# Patient Record
Sex: Male | Born: 1980 | Race: White | Hispanic: No | Smoking: Current every day smoker
Health system: Southern US, Community
[De-identification: ages and names within clinical notes are randomized; demographics above are authoritative.]

## PROBLEM LIST (undated history)

## (undated) DIAGNOSIS — K219 Gastro-esophageal reflux disease without esophagitis: Secondary | ICD-10-CM

## (undated) DIAGNOSIS — G44009 Cluster headache syndrome, unspecified, not intractable: Secondary | ICD-10-CM

## (undated) DIAGNOSIS — Z8489 Family history of other specified conditions: Secondary | ICD-10-CM

---

## 2009-06-30 ENCOUNTER — Emergency Department (HOSPITAL_BASED_OUTPATIENT_CLINIC_OR_DEPARTMENT_OTHER): Admission: EM | Admit: 2009-06-30 | Discharge: 2009-06-30 | Payer: Self-pay | Admitting: Emergency Medicine

## 2010-08-04 LAB — URINALYSIS, ROUTINE W REFLEX MICROSCOPIC
Bilirubin Urine: NEGATIVE
Ketones, ur: NEGATIVE mg/dL
Nitrite: NEGATIVE
Urobilinogen, UA: 0.2 mg/dL (ref 0.0–1.0)
pH: 7.5 (ref 5.0–8.0)

## 2010-08-04 LAB — DIFFERENTIAL
Basophils Absolute: 0 10*3/uL (ref 0.0–0.1)
Eosinophils Absolute: 0.1 10*3/uL (ref 0.0–0.7)
Eosinophils Relative: 1 % (ref 0–5)

## 2010-08-04 LAB — CBC
MCV: 89.3 fL (ref 78.0–100.0)
RBC: 5.47 MIL/uL (ref 4.22–5.81)
WBC: 6.9 10*3/uL (ref 4.0–10.5)

## 2010-08-04 LAB — COMPREHENSIVE METABOLIC PANEL
ALT: 72 U/L — ABNORMAL HIGH (ref 0–53)
AST: 50 U/L — ABNORMAL HIGH (ref 0–37)
CO2: 30 mEq/L (ref 19–32)
Chloride: 105 mEq/L (ref 96–112)
GFR calc Af Amer: >60 mL/min (ref >60)
GFR calc non Af Amer: >60 mL/min (ref >60)
Sodium: 143 mEq/L (ref 135–145)
Total Bilirubin: 0.4 mg/dL (ref 0.3–1.2)

## 2010-08-04 LAB — LIPASE, BLOOD: Lipase: 154 U/L (ref 23–300)

## 2012-03-04 ENCOUNTER — Emergency Department (HOSPITAL_BASED_OUTPATIENT_CLINIC_OR_DEPARTMENT_OTHER)
Admission: EM | Admit: 2012-03-04 | Discharge: 2012-03-04 | Discharge status: Against Medical Advice | Payer: Self-pay | Attending: Emergency Medicine | Admitting: Emergency Medicine

## 2012-03-04 ENCOUNTER — Encounter (HOSPITAL_BASED_OUTPATIENT_CLINIC_OR_DEPARTMENT_OTHER): Payer: Self-pay | Admitting: Emergency Medicine

## 2012-03-04 DIAGNOSIS — R51 Headache: Secondary | ICD-10-CM | POA: Insufficient documentation

## 2012-03-04 DIAGNOSIS — F17200 Nicotine dependence, unspecified, uncomplicated: Secondary | ICD-10-CM | POA: Insufficient documentation

## 2012-03-04 DIAGNOSIS — K219 Gastro-esophageal reflux disease without esophagitis: Secondary | ICD-10-CM | POA: Insufficient documentation

## 2012-03-04 HISTORY — DX: Gastro-esophageal reflux disease without esophagitis: K21.9

## 2012-03-04 NOTE — ED Notes (Addendum)
Intermittent HAs since 0800 this morning lasting from 30 to 90 minutes.  Currently top of head is tender to touch.  During his HA he sts "I'd like to cut my head off."  Only one episode of this type of HA in the past - 1 yr ago.

## 2012-03-04 NOTE — ED Notes (Signed)
Called pt to treatment room with no answer from lobby 

## 2012-03-04 NOTE — ED Notes (Signed)
Called from lobby to treatment room with no answer. 

## 2012-03-05 ENCOUNTER — Emergency Department (HOSPITAL_BASED_OUTPATIENT_CLINIC_OR_DEPARTMENT_OTHER): Payer: Self-pay

## 2012-03-05 ENCOUNTER — Emergency Department (HOSPITAL_BASED_OUTPATIENT_CLINIC_OR_DEPARTMENT_OTHER)
Admission: EM | Admit: 2012-03-05 | Discharge: 2012-03-05 | Disposition: A | Discharge status: Home / Self Care | Payer: Self-pay | Attending: Emergency Medicine | Admitting: Emergency Medicine

## 2012-03-05 ENCOUNTER — Encounter (HOSPITAL_BASED_OUTPATIENT_CLINIC_OR_DEPARTMENT_OTHER): Payer: Self-pay

## 2012-03-05 DIAGNOSIS — R51 Headache: Secondary | ICD-10-CM | POA: Insufficient documentation

## 2012-03-05 DIAGNOSIS — Z8719 Personal history of other diseases of the digestive system: Secondary | ICD-10-CM | POA: Insufficient documentation

## 2012-03-05 DIAGNOSIS — J329 Chronic sinusitis, unspecified: Secondary | ICD-10-CM | POA: Insufficient documentation

## 2012-03-05 DIAGNOSIS — R112 Nausea with vomiting, unspecified: Secondary | ICD-10-CM | POA: Insufficient documentation

## 2012-03-05 DIAGNOSIS — F17200 Nicotine dependence, unspecified, uncomplicated: Secondary | ICD-10-CM | POA: Insufficient documentation

## 2012-03-05 MED ORDER — ISOMETHEPTENE-APAP-DICHLORAL 65-325-100 MG PO CAPS: 1.0000 | ORAL_CAPSULE | Freq: Four times a day (QID) | ORAL | Status: DC | PRN

## 2012-03-05 MED ORDER — AMOXICILLIN 500 MG PO CAPS: 500.0000 mg | ORAL_CAPSULE | Freq: Three times a day (TID) | ORAL | Status: DC

## 2012-03-05 NOTE — ED Notes (Signed)
Patient transported to CT 

## 2012-03-05 NOTE — ED Notes (Signed)
Pt reports a headache and nausea that started yesterday.  He came to ED but left prior to being seen.

## 2012-03-05 NOTE — ED Provider Notes (Addendum)
History     CSN: [id number] Arrival date & time 03/05/12  1710 First MD Initiated Contact with Patient 03/05/12 1727     Chief Complaint  Patient presents with  . Headache    Patient is a 31 y.o. male presenting with headaches. The history is provided by the patient.  Headache  The current episode started yesterday. The problem occurs every few minutes. The problem has been gradually improving. The headache is associated with nothing. Pain location: all over. The quality of the pain is described as sharp. The pain is at a severity of 10/10. The pain does not radiate. Associated symptoms include nausea and vomiting. Pertinent negatives include no anorexia and no fever. He has tried nothing for the symptoms. The treatment provided no relief.  Pt has this before in the past.  The headaches have been coming and going over the last day.  It is severe when it occurs.  Last night it was more severe but today it has been sore.  The headache is a 1/10. No fever.  No neck stiffness  Past Medical History  Diagnosis Date  . GERD (gastroesophageal reflux disease)     History reviewed. No pertinent past surgical history.  No family history on file.  History  Substance Use Topics  . Smoking status: Current Every Day Smoker -- 0.5 packs/day  . Smokeless tobacco: Never Used  . Alcohol Use: Yes     occasionally      Review of Systems  Constitutional: Negative for fever.  Gastrointestinal: Positive for nausea and vomiting. Negative for anorexia.  Neurological: Positive for headaches.  All other systems reviewed and are negative.    Allergies  Review of patient's allergies indicates no known allergies.  Home Medications   Current Outpatient Rx  Name Route Sig Dispense Refill  . OMEPRAZOLE 20 MG PO CPDR Oral Take 20 mg by mouth 3 (three) times daily as needed.      BP 154/94  Pulse 79  Temp 98.6 F (37 C) (Oral)  Resp 16  Ht 6\' 1"  (1.854 m)  Wt 200 lb (90.719 kg)  BMI 26.39  kg/m2  SpO2 100%  Physical Exam  Nursing note and vitals reviewed. Constitutional: He is oriented to person, place, and time. He appears well-developed and well-nourished. No distress.  HENT:  Head: Normocephalic and atraumatic.  Right Ear: External ear normal.  Left Ear: External ear normal.  Mouth/Throat: Oropharynx is clear and moist.  Eyes: Conjunctivae normal are normal. Right eye exhibits no discharge. Left eye exhibits no discharge. No scleral icterus.  Neck: Neck supple. No tracheal deviation present.  Cardiovascular: Normal rate, regular rhythm and intact distal pulses.   Pulmonary/Chest: Effort normal and breath sounds normal. No stridor. No respiratory distress. He has no wheezes. He has no rales.  Abdominal: Soft. Bowel sounds are normal. He exhibits no distension. There is no tenderness. There is no rebound and no guarding.  Musculoskeletal: He exhibits no edema and no tenderness.  Neurological: He is alert and oriented to person, place, and time. He has normal strength. No cranial nerve deficit ( no gross defecits noted) or sensory deficit. He exhibits normal muscle tone. He displays no seizure activity. Coordination normal.       No pronator drift bilateral upper extrem, able to hold both legs off bed for 5 seconds, sensation intact in all extremities, no visual field cuts, no left or right sided neglect  Skin: Skin is warm and dry. No rash noted.  Psychiatric: He  has a normal mood and affect.    ED Course  Procedures (including critical care time)  Labs Reviewed - No data to display Ct Head Wo Contrast  03/05/2012  *RADIOLOGY REPORT*  Clinical Data: Headache, nausea and vomiting  CT HEAD WITHOUT CONTRAST  Technique:  Contiguous axial images were obtained from the base of the skull through the vertex without contrast.  Comparison: None.  Findings: The ventricular system is normal in size and configuration, and the septum is in a normal midline position.  The fourth  ventricle and basilar cisterns appear normal.  No hemorrhage, mass lesion, or acute infarction is seen.  On bone window images there is air fluid level in both maxillary sinuses consistent with bilateral maxillary sinusitis.  No acute calvarial abnormality is seen.  IMPRESSION:  1.  No acute intracranial abnormality. 2.  Bilateral maxillary sinusitis.   Original Report Authenticated By: Juline Patch, M.D.      MDM  Patient describes recurrent headaches that started yesterday although he has had these before. There is no thunderclap onset to suggest subarachnoid hemorrhage. The patient's symptoms have all resolved at this point. It is possible he could be having cluster headaches versus migraines. Abdomen prescription for Imitrex and I recommended she follow up with primary doctor or Dr. or further evaluation if the symptoms persist  CT scan does show bilateral maxillary sinusitis. It is possible this could be related to his headaches although patient has not mentioned any significant nasal congestion or URI symptoms   Celene Kras, MD 03/05/12 1820

## 2012-06-23 ENCOUNTER — Emergency Department (HOSPITAL_BASED_OUTPATIENT_CLINIC_OR_DEPARTMENT_OTHER)
Admission: EM | Admit: 2012-06-23 | Discharge: 2012-06-23 | Disposition: A | Discharge status: Home / Self Care | Payer: 59 | Attending: Emergency Medicine | Admitting: Emergency Medicine

## 2012-06-23 ENCOUNTER — Encounter (HOSPITAL_BASED_OUTPATIENT_CLINIC_OR_DEPARTMENT_OTHER): Payer: Self-pay | Admitting: *Deleted

## 2012-06-23 DIAGNOSIS — F17200 Nicotine dependence, unspecified, uncomplicated: Secondary | ICD-10-CM | POA: Insufficient documentation

## 2012-06-23 DIAGNOSIS — K219 Gastro-esophageal reflux disease without esophagitis: Secondary | ICD-10-CM | POA: Insufficient documentation

## 2012-06-23 DIAGNOSIS — G43909 Migraine, unspecified, not intractable, without status migrainosus: Secondary | ICD-10-CM | POA: Insufficient documentation

## 2012-06-23 DIAGNOSIS — Z79899 Other long term (current) drug therapy: Secondary | ICD-10-CM | POA: Insufficient documentation

## 2012-06-23 HISTORY — DX: Cluster headache syndrome, unspecified, not intractable: G44.009

## 2012-06-23 MED ORDER — METOCLOPRAMIDE HCL 10 MG PO TABS
10.0000 mg | ORAL_TABLET | Freq: Once | ORAL | Status: AC
  Administered 2012-06-23: 10 mg via ORAL
  Filled 2012-06-23: qty 1

## 2012-06-23 MED ORDER — ISOMETHEPTENE-APAP-DICHLORAL 65-325-100 MG PO CAPS: 1.0000 | ORAL_CAPSULE | Freq: Four times a day (QID) | ORAL | Status: DC | PRN

## 2012-06-23 MED ORDER — KETOROLAC TROMETHAMINE 60 MG/2ML IM SOLN
60.0000 mg | Freq: Once | INTRAMUSCULAR | Status: AC
  Administered 2012-06-23: 60 mg via INTRAMUSCULAR
  Filled 2012-06-23: qty 2

## 2012-06-23 NOTE — ED Provider Notes (Signed)
History  This chart was scribed for Richardean Canal, MD by Shari Heritage, ED Scribe. The patient was seen in room MH05/MH05. Patient's care was started at 1806.   CSN: [id number]  Arrival date & time 06/23/12  1625   First MD Initiated Contact with Patient 06/23/12 1806      Chief Complaint  Patient presents with  . Headache    The history is provided by the patient. No language interpreter was used.    HPI Comments: Alan Young is a 32 y.o. male with history of cluster headaches who presents to the Emergency Department complaining of constant, non-radiating bilateral temporoparietal headache onset 24 hours ago. Patient denies any associated symptoms at this time. Patient states that he was diagnosed with cluster headaches here in November 2013. He says that he was given an injection of pain medicine during his last visit for treatment, but does not remember what the medicine was called. Patient has taken midrin for this problem in the past, but states that he ran out in December. Patient has not followed up with a neurologist for his problem. Patient's other medical history includes GERD.    Past Medical History  Diagnosis Date  . GERD (gastroesophageal reflux disease)   . Cluster headaches     History reviewed. No pertinent past surgical history.  History reviewed. No pertinent family history.  History  Substance Use Topics  . Smoking status: Current Every Day Smoker -- 0.50 packs/day  . Smokeless tobacco: Never Used  . Alcohol Use: Yes     Comment: occasionally      Review of Systems  Neurological: Positive for headaches. Negative for dizziness.  All other systems reviewed and are negative.    Allergies  Review of patient's allergies indicates no known allergies.  Home Medications   Current Outpatient Rx  Name  Route  Sig  Dispense  Refill  . amoxicillin (AMOXIL) 500 MG capsule   Oral   Take 1 capsule (500 mg total) by mouth 3 (three) times daily.   21 capsule  0   . isometheptene-acetaminophen-dichloralphenazone (MIDRIN) 65-325-100 MG capsule   Oral   Take 1 capsule by mouth 4 (four) times daily as needed.   30 capsule   0   . omeprazole (PRILOSEC) 20 MG capsule   Oral   Take 20 mg by mouth 3 (three) times daily as needed.           Triage Vitals: BP 161/92  Pulse 98  Temp(Src) 98.2 F (36.8 C) (Oral)  Resp 16  Ht 6\' 1"  (1.854 m)  Wt 190 lb (86.183 kg)  BMI 25.07 kg/m2  SpO2 100%  Physical Exam  Constitutional: He is oriented to person, place, and time. He appears well-developed and well-nourished. No distress.  HENT:  Head: Normocephalic and atraumatic.  Eyes: Conjunctivae and EOM are normal. Pupils are equal, round, and reactive to light.  Neck: Normal range of motion. Neck supple.  Cardiovascular: Normal rate and regular rhythm.   Pulmonary/Chest: Effort normal and breath sounds normal.  Abdominal: He exhibits no distension.  Musculoskeletal: Normal range of motion.  Neurological: He is alert and oriented to person, place, and time.  No focal neurological deficits. Moving all extremities without difficulty.  Skin: Skin is warm and dry. No rash noted.  Psychiatric: He has a normal mood and affect. His behavior is normal.    ED Course  Procedures (including critical care time) DIAGNOSTIC STUDIES: Oxygen Saturation is 100% on room air, normal by my  interpretation.    COORDINATION OF CARE: 6:08 PM- Patient informed of current plan for treatment and evaluation and agrees with plan at this time.      Labs Reviewed - No data to display No results found.   No diagnosis found.    MDM  Alan Young is a 32 y.o. male here with headache. It was his typical migraine/cluster headache (didn't have workup before). Pain improved with toradol and reglan IM. Nonfocal neuro exam and no concern for subarachnoid. He wants another prescription of midrin so I gave him a refill. I told him to f/u with his PMD or neurologist to get  better control.     I personally performed the services described in this documentation, which was scribed in my presence. The recorded information has been reviewed and is accurate.    Richardean Canal, MD 06/23/12 [phone]

## 2012-06-23 NOTE — ED Notes (Addendum)
Pt sattes he has a hx of cluster headaches-dx'd by [place]. Has had this one x 24 hours. PERL

## 2013-07-14 ENCOUNTER — Encounter (HOSPITAL_BASED_OUTPATIENT_CLINIC_OR_DEPARTMENT_OTHER): Payer: Self-pay | Admitting: Emergency Medicine

## 2013-07-14 ENCOUNTER — Emergency Department (HOSPITAL_BASED_OUTPATIENT_CLINIC_OR_DEPARTMENT_OTHER)
Admission: EM | Admit: 2013-07-14 | Discharge: 2013-07-15 | Disposition: A | Discharge status: Home / Self Care | Payer: 59 | Attending: Emergency Medicine | Admitting: Emergency Medicine

## 2013-07-14 DIAGNOSIS — Z79899 Other long term (current) drug therapy: Secondary | ICD-10-CM | POA: Insufficient documentation

## 2013-07-14 DIAGNOSIS — Z792 Long term (current) use of antibiotics: Secondary | ICD-10-CM | POA: Insufficient documentation

## 2013-07-14 DIAGNOSIS — J020 Streptococcal pharyngitis: Secondary | ICD-10-CM | POA: Insufficient documentation

## 2013-07-14 DIAGNOSIS — M542 Cervicalgia: Secondary | ICD-10-CM | POA: Insufficient documentation

## 2013-07-14 DIAGNOSIS — F17200 Nicotine dependence, unspecified, uncomplicated: Secondary | ICD-10-CM | POA: Insufficient documentation

## 2013-07-14 DIAGNOSIS — H9209 Otalgia, unspecified ear: Secondary | ICD-10-CM | POA: Insufficient documentation

## 2013-07-14 DIAGNOSIS — K219 Gastro-esophageal reflux disease without esophagitis: Secondary | ICD-10-CM | POA: Insufficient documentation

## 2013-07-14 NOTE — ED Notes (Signed)
Headache. Fever, stuffy head, neck tightness runny nose, sore throat and ear pain x 1 week.

## 2013-07-15 LAB — RAPID STREP SCREEN (MED CTR MEBANE ONLY): STREPTOCOCCUS, GROUP A SCREEN (DIRECT): POSITIVE — AB

## 2013-07-15 MED ORDER — PENICILLIN G BENZATHINE 1200000 UNIT/2ML IM SUSP
1.2000 10*6.[IU] | Freq: Once | INTRAMUSCULAR | Status: AC
  Administered 2013-07-15: 1.2 10*6.[IU] via INTRAMUSCULAR
  Filled 2013-07-15: qty 2

## 2013-07-15 MED ORDER — IBUPROFEN 800 MG PO TABS
800.0000 mg | ORAL_TABLET | Freq: Once | ORAL | Status: AC
  Administered 2013-07-15: 800 mg via ORAL
  Filled 2013-07-15: qty 1

## 2013-07-15 NOTE — ED Notes (Signed)
Pt reports sick with sinus infection last few weeks, improved, couple of days ago left ear and head started hurting

## 2013-07-15 NOTE — ED Provider Notes (Signed)
CSN: [id number]     Arrival date & time 07/14/13  2122 History  This chart was scribed for Charles B. Bernette MayersSheldon, MD by Beverly MilchJ Harrison Collins, ED Scribe. This patient was seen in room MH05/MH05 and the patient's care was started at 12:03 AM.    Chief Complaint  Patient presents with  . Headache      The history is provided by the patient. No language interpreter was used.   HPI Comments: Alan Young is a 33 y.o. male who presents to the Emergency Department complaining of head aches with associated soreness at the base of the neck, left ear pain, cough, sore throat and fever. Pt says symptoms initially began a week ago peaking roughly 3 days ago. 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 Pt denies edema in the extremities and rash. He reports he is a current smoker.   Past Medical History  Diagnosis Date  . GERD (gastroesophageal reflux disease)   . Cluster headaches    History reviewed. No pertinent past surgical history. No family history on file. History  Substance Use Topics  . Smoking status: Current Every Day Smoker -- 0.50 packs/day  . Smokeless tobacco: Never Used  . Alcohol Use: Yes     Comment: occasionally    Review of Systems A complete 10 system review of systems was obtained and all systems are negative except as noted in the HPI and PMH.     Allergies  Review of patient's allergies indicates no known allergies.  Home Medications   Current Outpatient Rx  Name  Route  Sig  Dispense  Refill  . amphetamine-dextroamphetamine (ADDERALL) 20 MG tablet   Oral   Take 20 mg by mouth daily.         Marland Kitchen. amoxicillin (AMOXIL) 500 MG capsule   Oral   Take 1 capsule (500 mg total) by mouth 3 (three) times daily.   21 capsule   0   . isometheptene-acetaminophen-dichloralphenazone (MIDRIN) 65-325-100 MG capsule   Oral   Take 1 capsule by mouth 4 (four) times daily as needed.   30 capsule   0   .  isometheptene-acetaminophen-dichloralphenazone (MIDRIN) 65-325-100 MG capsule   Oral   Take 1 capsule by mouth 4 (four) times daily as needed.   30 capsule   0   . omeprazole (PRILOSEC) 20 MG capsule   Oral   Take 20 mg by mouth 3 (three) times daily as needed.          Triage Vitals: BP 152/100  Pulse 114  Temp(Src) 99.1 F (37.3 C) (Oral)  Resp 18  Ht 6' (1.829 m)  Wt 215 lb (97.523 kg)  BMI 29.15 kg/m2  Physical Exam  Nursing note and vitals reviewed. Constitutional: He is oriented to person, place, and time. He appears well-developed and well-nourished.  HENT:  Head: Normocephalic and atraumatic.  Right Ear: Tympanic membrane normal.  Left Ear: Tympanic membrane normal.  Mouth/Throat: Posterior oropharyngeal edema and posterior oropharyngeal erythema present. No oropharyngeal exudate.  Eyes: EOM are normal. Pupils are equal, round, and reactive to light.  Neck: Normal range of motion. Neck supple.  No meningismus  Cardiovascular: Normal rate, normal heart sounds and intact distal pulses.   Pulmonary/Chest: Effort normal and breath sounds normal.  Abdominal: Bowel sounds are normal. He exhibits no distension. There is no tenderness.  Musculoskeletal: Normal range of motion. He exhibits no edema and no tenderness.  Lymphadenopathy:  He has cervical adenopathy.  Neurological: He is alert and oriented to person, place, and time. He has normal strength. No cranial nerve deficit or sensory deficit.  Skin: Skin is warm and dry. No rash noted.  Psychiatric: He has a normal mood and affect.    ED Course  Procedures (including critical care time)  DIAGNOSTIC STUDIES:  COORDINATION OF CARE: 12:09 AM- Pt advised of plan for treatment and pt agrees.    Labs Review Labs Reviewed - No data to display Imaging Review No results found.   EKG Interpretation None      MDM   Final diagnoses:  Strep pharyngitis   Strep pos, IM PCN. PCP followup.   I personally  performed the services described in this documentation, which was scribed in my presence. The recorded information has been reviewed and is accurate.     Charles B. Bernette Mayers, MD 07/15/13 [phone]

## 2013-07-15 NOTE — Discharge Instructions (Signed)
Strep Throat  Strep throat is an infection of the throat caused by a bacteria named Streptococcus pyogenes. Your caregiver may call the infection streptococcal "tonsillitis" or "pharyngitis" depending on whether there are signs of inflammation in the tonsils or back of the throat. Strep throat is most common in children aged 34 15 years during the cold months of the year, but it can occur in people of any age during any season. This infection is spread from person to person (contagious) through coughing, sneezing, or other close contact.  SYMPTOMS   · Fever or chills.  · Painful, swollen, red tonsils or throat.  · Pain or difficulty when swallowing.  · White or yellow spots on the tonsils or throat.  · Swollen, tender lymph nodes or "glands" of the neck or under the jaw.  · Red rash all over the body (rare).  DIAGNOSIS   Many different infections can cause the same symptoms. A test must be done to confirm the diagnosis so the right treatment can be given. A "rapid strep test" can help your caregiver make the diagnosis in a few minutes. If this test is not available, a light swab of the infected area can be used for a throat culture test. If a throat culture test is done, results are usually available in a day or two.  TREATMENT   Strep throat is treated with antibiotic medicine.  HOME CARE INSTRUCTIONS   · Gargle with 1 tsp of salt in 1 cup of warm water, 3 4 times per day or as needed for comfort.  · Family members who also have a sore throat or fever should be tested for strep throat and treated with antibiotics if they have the strep infection.  · Make sure everyone in your household washes their hands well.  · Do not share food, drinking cups, or personal items that could cause the infection to spread to others.  · You may need to eat a soft food diet until your sore throat gets better.  · Drink enough water and fluids to keep your urine clear or pale yellow. This will help prevent dehydration.  · Get plenty of  rest.  · Stay home from school, daycare, or work until you have been on antibiotics for 24 hours.  · Only take over-the-counter or prescription medicines for pain, discomfort, or fever as directed by your caregiver.  · If antibiotics are prescribed, take them as directed. Finish them even if you start to feel better.  SEEK MEDICAL CARE IF:   · The glands in your neck continue to enlarge.  · You develop a rash, cough, or earache.  · You cough up green, yellow-brown, or bloody sputum.  · You have pain or discomfort not controlled by medicines.  · Your problems seem to be getting worse rather than better.  SEEK IMMEDIATE MEDICAL CARE IF:   · You develop any new symptoms such as vomiting, severe headache, stiff or painful neck, chest pain, shortness of breath, or trouble swallowing.  · You develop severe throat pain, drooling, or changes in your voice.  · You develop swelling of the neck, or the skin on the neck becomes red and tender.  · You have a fever.  · You develop signs of dehydration, such as fatigue, dry mouth, and decreased urination.  · You become increasingly sleepy, or you cannot wake up completely.  Document Released: 04/28/2000 Document Revised: 04/17/2012 Document Reviewed: 06/30/2010  [employer]® Patient Information ©2014 [employer].

## 2014-03-11 IMAGING — CT CT HEAD W/O CM
1 series · 16 of 30 positions shown, 20 images · non-contrast
Comparison: None.

CLINICAL DATA: Headache, nausea and vomiting

CT HEAD WITHOUT CONTRAST
TECHNIQUE: Contiguous axial images were obtained from the base of
the skull through the vertex without contrast.

[Series 2: head 4.8 h37s · axial · 0.45mm/px · z∈[+1116,+1252]mm · 16 of 32 slices shown, 20 images]
[im 2/32  brain]
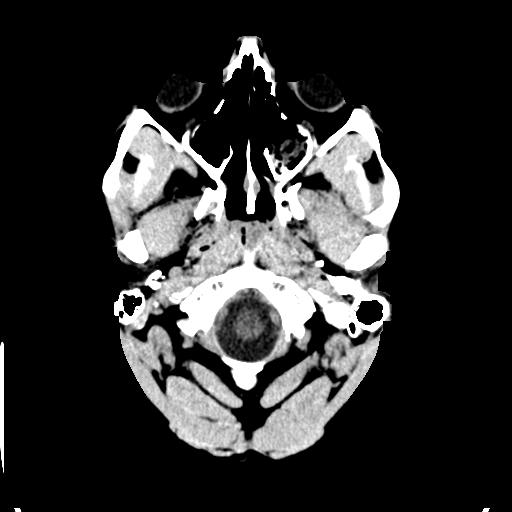
[im 2/32  bone]
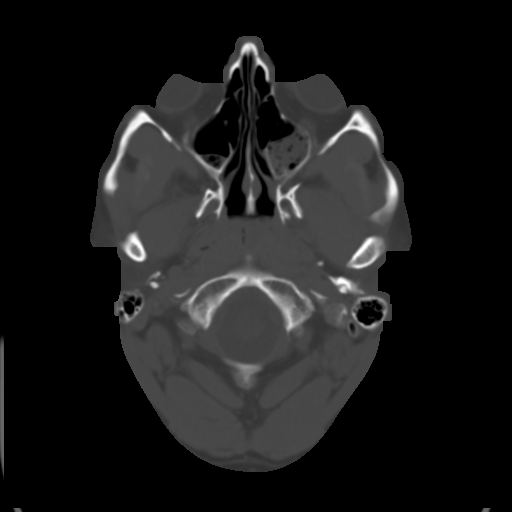
[im 4/32  brain]
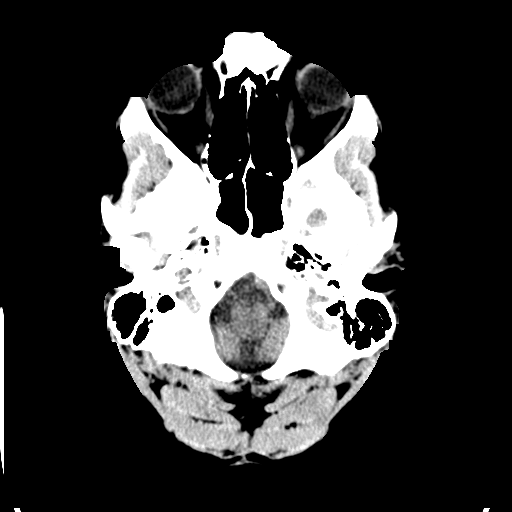
[im 6/32  brain]
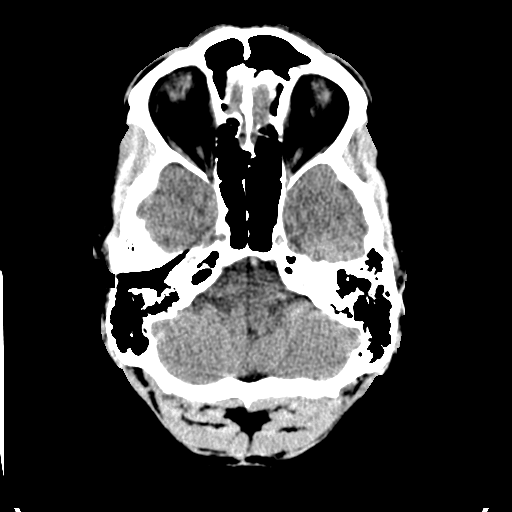
[im 8/32  brain]
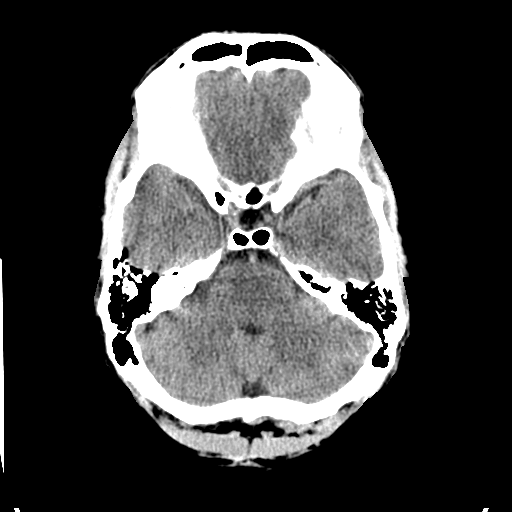
[im 9/32  brain]
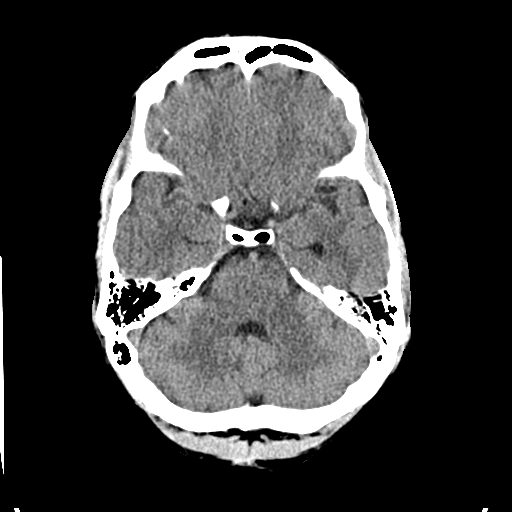
[im 9/32  bone]
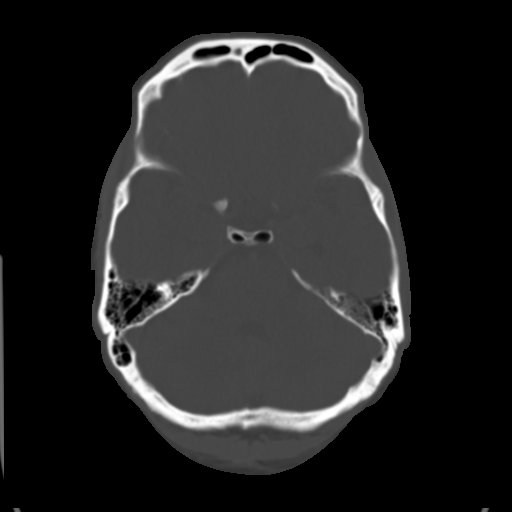
[im 11/32  brain]
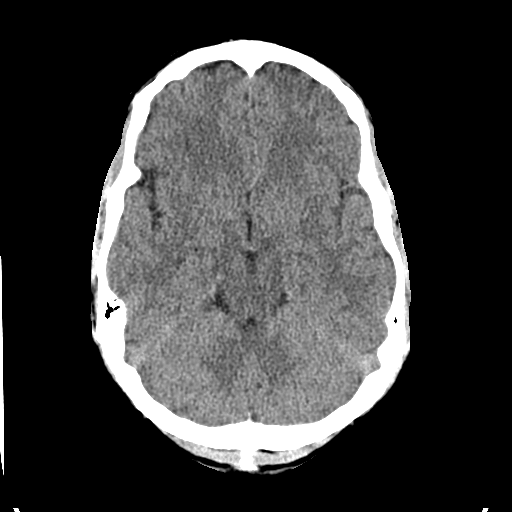
[im 13/32  brain]
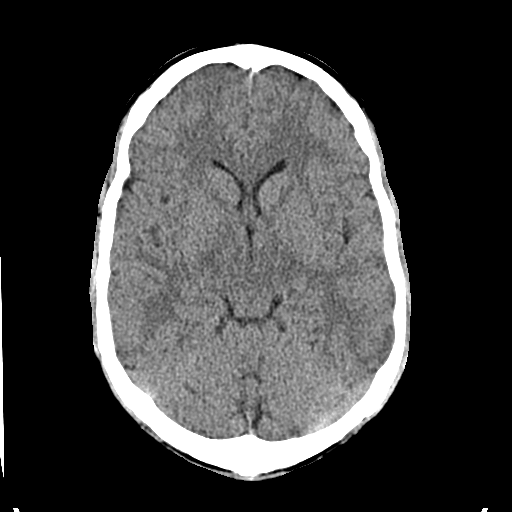
[im 15/32  brain]
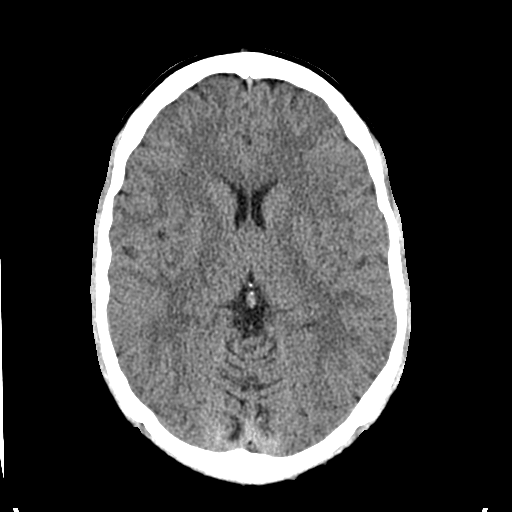
[im 17/32  brain]
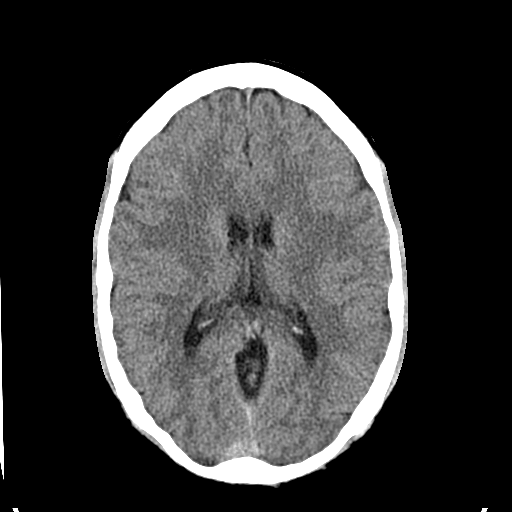
[im 17/32  bone]
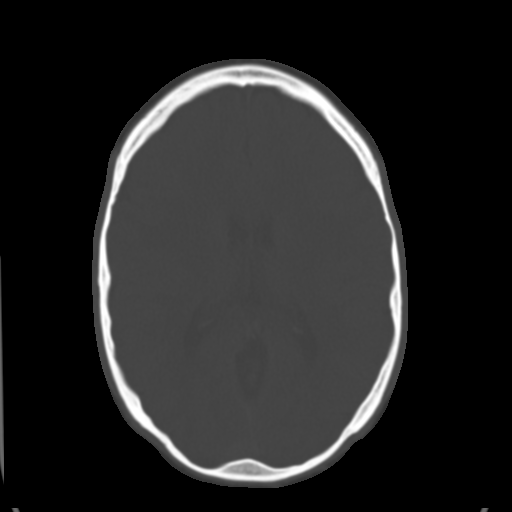
[im 19/32  brain]
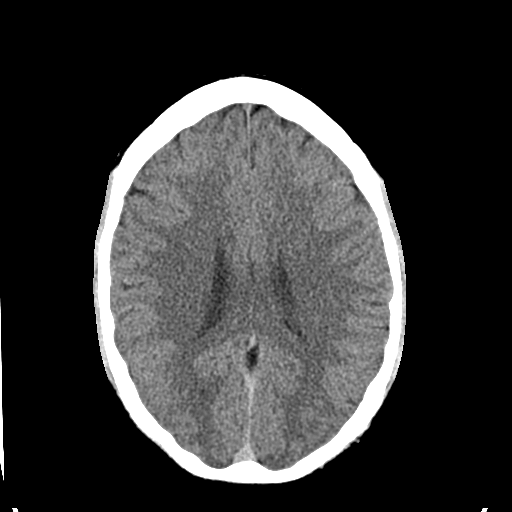
[im 21/32  brain]
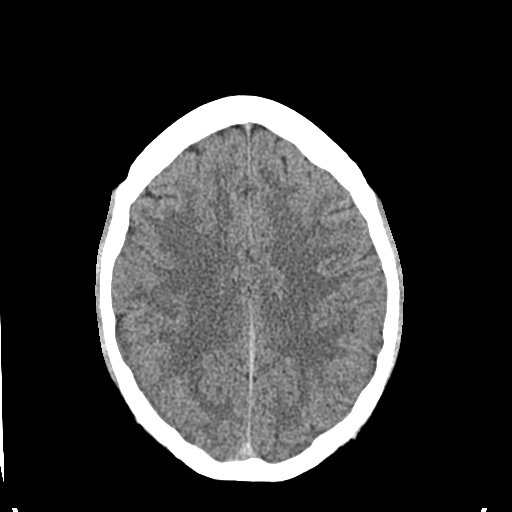
[im 23/32  brain]
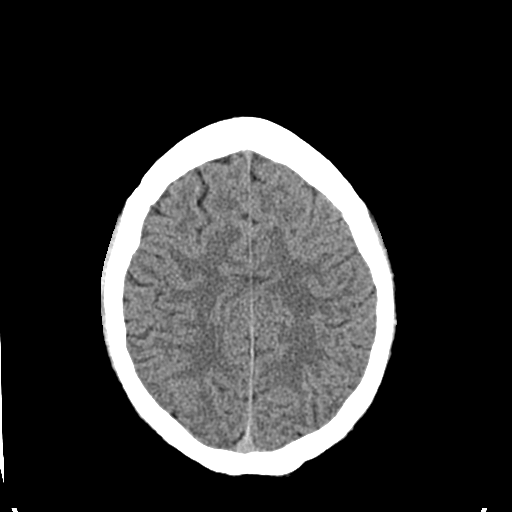
[im 24/32  brain]
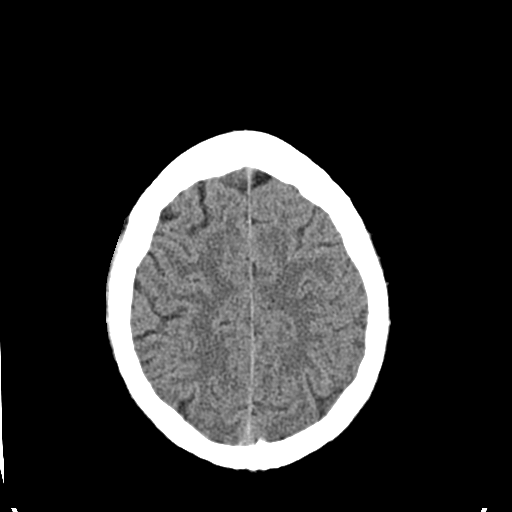
[im 24/32  bone]
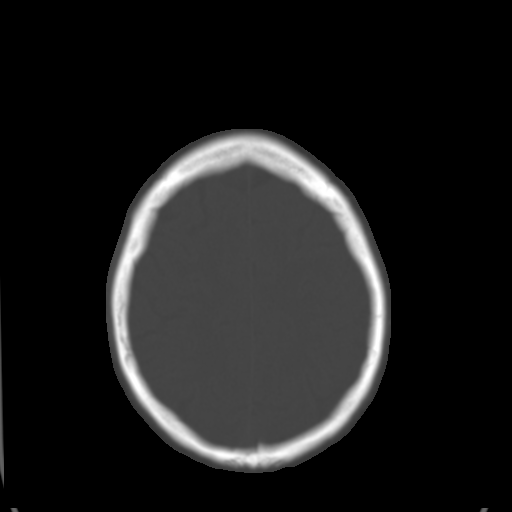
[im 26/32  brain]
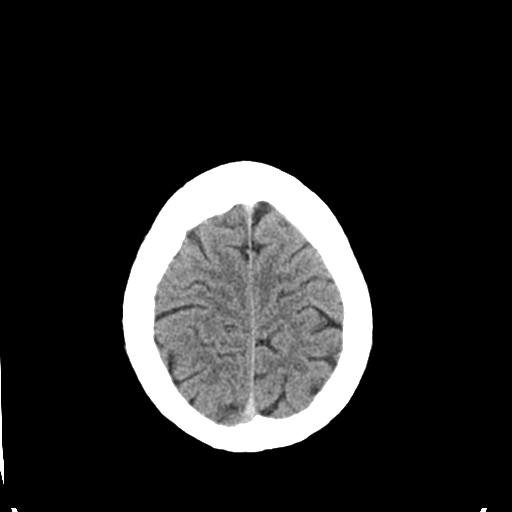
[im 28/32  brain]
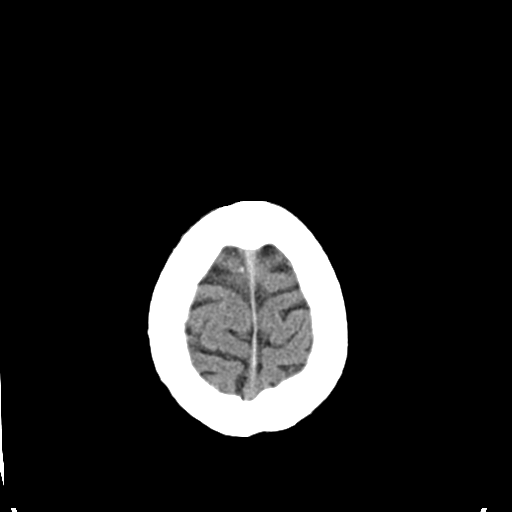
[im 30/32  brain]
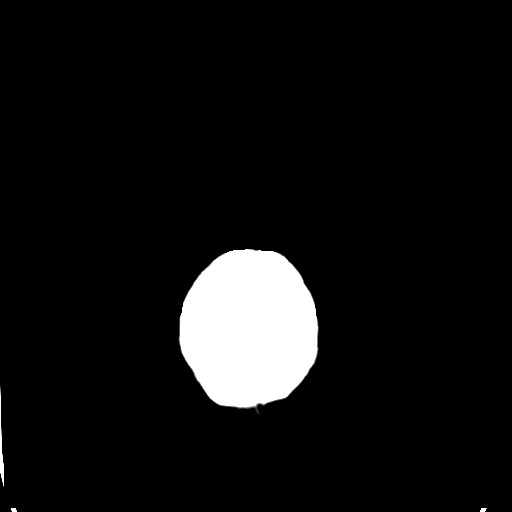

[16 of 30 positions shown; findings below may reference images not displayed]

FINDINGS: The ventricular system is normal in size and
configuration, and the septum is in a normal midline position.  The
fourth ventricle and basilar cisterns appear normal.  No
hemorrhage, mass lesion, or acute infarction is seen.  On bone
window images there is air fluid level in both maxillary sinuses
consistent with bilateral maxillary sinusitis.  No acute calvarial
abnormality is seen.
IMPRESSION: 1.  No acute intracranial abnormality.
2.  Bilateral maxillary sinusitis.

## 2016-02-04 ENCOUNTER — Other Ambulatory Visit: Payer: Self-pay | Admitting: Family Medicine

## 2023-10-28 ENCOUNTER — Emergency Department (HOSPITAL_BASED_OUTPATIENT_CLINIC_OR_DEPARTMENT_OTHER)

## 2023-10-28 ENCOUNTER — Observation Stay (HOSPITAL_BASED_OUTPATIENT_CLINIC_OR_DEPARTMENT_OTHER)
Admission: EM | Admit: 2023-10-28 | Discharge: 2023-10-29 | Disposition: A | Discharge status: Home / Self Care | Attending: General Surgery | Admitting: General Surgery

## 2023-10-28 ENCOUNTER — Other Ambulatory Visit: Payer: Self-pay

## 2023-10-28 ENCOUNTER — Encounter (HOSPITAL_BASED_OUTPATIENT_CLINIC_OR_DEPARTMENT_OTHER): Payer: Self-pay | Admitting: Emergency Medicine

## 2023-10-28 DIAGNOSIS — K8012 Calculus of gallbladder with acute and chronic cholecystitis without obstruction: Principal | ICD-10-CM | POA: Insufficient documentation

## 2023-10-28 DIAGNOSIS — K819 Cholecystitis, unspecified: Secondary | ICD-10-CM | POA: Diagnosis present

## 2023-10-28 DIAGNOSIS — F17210 Nicotine dependence, cigarettes, uncomplicated: Secondary | ICD-10-CM | POA: Insufficient documentation

## 2023-10-28 DIAGNOSIS — K810 Acute cholecystitis: Principal | ICD-10-CM

## 2023-10-28 DIAGNOSIS — R1011 Right upper quadrant pain: Secondary | ICD-10-CM | POA: Diagnosis present

## 2023-10-28 HISTORY — DX: Family history of other specified conditions: Z84.89

## 2023-10-28 LAB — CBC
HCT: 48.8 % (ref 39.0–52.0)
Hemoglobin: 16.4 g/dL (ref 13.0–17.0)
MCH: 31.1 pg (ref 26.0–34.0)
MCHC: 33.6 g/dL (ref 30.0–36.0)
MCV: 92.6 fL (ref 80.0–100.0)
Platelets: 273 10*3/uL (ref 150–400)
RBC: 5.27 MIL/uL (ref 4.22–5.81)
RDW: 13.2 % (ref 11.5–15.5)
WBC: 8.6 10*3/uL (ref 4.0–10.5)
nRBC: 0 % (ref 0.0–0.2)

## 2023-10-28 LAB — COMPREHENSIVE METABOLIC PANEL WITH GFR
ALT: 25 U/L (ref 0–44)
AST: 30 U/L (ref 15–41)
Albumin: 4.7 g/dL (ref 3.5–5.0)
Alkaline Phosphatase: 117 U/L (ref 38–126)
Anion gap: 15 (ref 5–15)
BUN: 11 mg/dL (ref 6–20)
CO2: 23 mmol/L (ref 22–32)
Calcium: 9.2 mg/dL (ref 8.9–10.3)
Chloride: 103 mmol/L (ref 98–111)
Creatinine, Ser: 1.09 mg/dL (ref 0.61–1.24)
GFR, Estimated: >60 mL/min (ref >60)
Glucose, Bld: 98 mg/dL (ref 70–99)
Potassium: 4.1 mmol/L (ref 3.5–5.1)
Sodium: 141 mmol/L (ref 135–145)
Total Bilirubin: <0.2 mg/dL (ref 0.0–1.2)
Total Protein: 7.4 g/dL (ref 6.5–8.1)

## 2023-10-28 LAB — LIPASE, BLOOD: Lipase: 63 U/L — ABNORMAL HIGH (ref 11–51)

## 2023-10-28 MED ORDER — ONDANSETRON HCL 4 MG/2ML IJ SOLN
4.0000 mg | Freq: Once | INTRAMUSCULAR | Status: AC
Start: 2023-10-28 — End: 2023-10-28
  Administered 2023-10-28: 4 mg via INTRAVENOUS
  Filled 2023-10-28: qty 2

## 2023-10-28 MED ORDER — SODIUM CHLORIDE 0.9 % IV SOLN
INTRAVENOUS | Status: AC
Start: 2023-10-28 — End: 2023-10-29

## 2023-10-28 MED ORDER — MORPHINE SULFATE (PF) 4 MG/ML IV SOLN
4.0000 mg | Freq: Once | INTRAVENOUS | Status: AC
  Administered 2023-10-28: 4 mg via INTRAVENOUS
  Filled 2023-10-28: qty 1

## 2023-10-28 MED ORDER — SODIUM CHLORIDE 0.9 % IV SOLN
1.0000 g | Freq: Once | INTRAVENOUS | Status: AC
  Administered 2023-10-28: 1 g via INTRAVENOUS
  Filled 2023-10-28: qty 10

## 2023-10-28 MED ORDER — HYDROMORPHONE HCL 1 MG/ML IJ SOLN
0.5000 mg | INTRAMUSCULAR | Status: AC | PRN
  Administered 2023-10-29 (×2): 0.5 mg via INTRAVENOUS
  Filled 2023-10-28: qty 0.5
  Filled 2023-10-28: qty 1

## 2023-10-28 MED ORDER — ONDANSETRON HCL 4 MG/2ML IJ SOLN: 4.0000 mg | Freq: Three times a day (TID) | INTRAMUSCULAR | Status: AC | PRN

## 2023-10-28 MED ORDER — METRONIDAZOLE 500 MG/100ML IV SOLN
500.0000 mg | Freq: Three times a day (TID) | INTRAVENOUS | Status: DC
  Administered 2023-10-29: 500 mg via INTRAVENOUS
  Filled 2023-10-28: qty 100

## 2023-10-28 NOTE — ED Provider Notes (Signed)
 [facility] Provider Note   CSN: [id number] Arrival date & time: 10/28/23  4098     Patient presents with: Abdominal Pain   Alan Young is a 43 y.o. male.  Patient presents to the emergency department today with concerns of abdominal pain.  He reports that he woke up this morning with significant right upper quadrant abdominal pain which she has never had previously.  He does report a history of diverticulitis and feels the pain is similar to this but notably worse.  Denies any nausea, vomiting, diarrhea.  Last bowel movement 2 days ago.  No reported fevers.   Abdominal Pain      Prior to Admission medications   Medication Sig Start Date End Date Taking? Authorizing Provider  amphetamine-dextroamphetamine (ADDERALL) 20 MG tablet Take 20 mg by mouth daily.    [provider]  omeprazole (PRILOSEC) 20 MG capsule Take 20 mg by mouth 3 (three) times daily as needed.    [provider]    Allergies: Patient has no known allergies.    Review of Systems  Gastrointestinal:  Positive for abdominal pain.  All other systems reviewed and are negative.   Updated Vital Signs BP (!) 166/120   Pulse 60   Temp 98.2 F (36.8 C) (Oral)   Resp 18   Ht 6' (1.829 m)   Wt 90.7 kg   SpO2 100%   BMI 27.12 kg/m   Physical Exam Vitals and nursing note reviewed.  Constitutional:      General: He is not in acute distress.    Appearance: He is well-developed.  HENT:     Head: Normocephalic and atraumatic.   Eyes:     Conjunctiva/sclera: Conjunctivae normal.    Cardiovascular:     Rate and Rhythm: Normal rate and regular rhythm.     Heart sounds: No murmur heard. Pulmonary:     Effort: Pulmonary effort is normal. No respiratory distress.     Breath sounds: Normal breath sounds.  Abdominal:     Palpations: Abdomen is soft.     Tenderness: There is abdominal tenderness in the right upper quadrant. There is guarding.    Musculoskeletal:        General: No swelling.     Cervical back: Neck supple.   Skin:    General: Skin is warm and dry.     Capillary Refill: Capillary refill takes less than 2 seconds.   Neurological:     Mental Status: He is alert.   Psychiatric:        Mood and Affect: Mood normal.     (all labs ordered are listed, but only abnormal results are displayed) Labs Reviewed  LIPASE, BLOOD - Abnormal; Notable for the following components:      Result Value   Lipase 63 (*)    All other components within normal limits  COMPREHENSIVE METABOLIC PANEL WITH GFR  CBC  URINALYSIS, ROUTINE W REFLEX MICROSCOPIC  LIPASE, BLOOD  HEPATIC FUNCTION PANEL    EKG: EKG Interpretation Date/Time:  Sunday October 28 2023 19:16:43 EDT Ventricular Rate:  84 PR Interval:  135 QRS Duration:  91 QT Interval:  378 QTC Calculation: 447 R Axis:   91  Text Interpretation: Sinus rhythm Borderline right axis deviation Baseline wander in lead(s) V4 V6 No significant change since last tracing Confirmed by Florette Hurry [phone]) on 10/28/2023 10:12:26 PM  Radiology: US  Abdomen Limited RUQ (LIVER/GB) Result Date: 10/28/2023 CLINICAL DATA:  [id number] RUQ  pain 151471 EXAM: ULTRASOUND ABDOMEN LIMITED RIGHT UPPER QUADRANT COMPARISON:  None Available. FINDINGS: Gallbladder: Calcified gallstones and gallbladder sludge within the gallbladder lumen. Associated gallbladder wall thickening and pericholecystic fluid. Positive sonographic Murphy sign noted by sonographer. Common bile duct: Diameter: 4 mm Liver: No focal lesion identified. Within normal limits in parenchymal echogenicity. Portal vein is patent on color Doppler imaging with normal direction of blood flow towards the liver. Other: None. IMPRESSION: Cholelithiasis and gallbladder sludge with associated acute cholecystitis. Electronically Signed   By: Morgane  Naveau M.D.   On: 10/28/2023 21:54    Procedures   Medications Ordered in the ED  cefTRIAXone  (ROCEPHIN) 1 g in sodium chloride 0.9 % 100 mL IVPB (1 g Intravenous New Bag/Given 10/28/23 2301)  metroNIDAZOLE (FLAGYL) IVPB 500 mg (has no administration in time range)  0.9 %  sodium chloride infusion ( Intravenous New Bag/Given 10/28/23 2259)  HYDROmorphone (DILAUDID) injection 0.5 mg (has no administration in time range)  ondansetron (ZOFRAN) injection 4 mg (has no administration in time range)  morphine (PF) 4 MG/ML injection 4 mg (4 mg Intravenous Given 10/28/23 2059)  ondansetron (ZOFRAN) injection 4 mg (4 mg Intravenous Given 10/28/23 2145)  morphine (PF) 4 MG/ML injection 4 mg (4 mg Intravenous Given 10/28/23 2209)                                  Medical Decision Making Amount and/or Complexity of Data Reviewed Labs: ordered. Radiology: ordered.  Risk Prescription drug management.   This patient presents to the ED for concern of abdominal pain.  Differential diagnosis includes bowel obstruction, diverticulitis, cholecystitis, cholangitis, choledocholithiasis   Lab Tests:  I Ordered, and personally interpreted labs.  The pertinent results include: CBC and CMP unremarkable, lipase elevated at 63, UA pending at time of admission   Imaging Studies ordered:  I ordered imaging studies including ultrasound right upper quadrant I independently visualized and interpreted imaging which showed Cholelithiasis and gallbladder sludge with associated acute cholecystitis. I agree with the radiologist interpretation   Medicines ordered and prescription drug management:  I ordered medication including morphine, Zofran, Rocephin, Flagyl for pain, nausea, intra-abdominal infection Reevaluation of the patient after these medicines showed that the patient improved I have reviewed the patients home medicines and have made adjustments as needed   Problem List / ED Course:  Patient without significant acute history presents emergency department with concern of abdominal pain.  He reports  he woke up this morning with epigastric/right upper quadrant abdominal pain with radiation around the upper abdomen with radiation as well to the shoulders.  No history of any similar symptoms.  No cardiac abnormalities.  Denies any significant nausea, vomiting, or diarrhea. On exam, patient has significant TTP in the right upper quadrant.  Concern for cholecystitis given the amount of pain.  Significant guarding present.  No other focal abdominal tenderness.  Normal bowel sounds.  Ordered ultrasound imaging as well as basic labs for assessment of possible source of symptoms. Lab workup is unremarkable.  Lipase is slightly elevated at 63.  Ultrasound results show acute cholecystitis.  Will place consult to general surgery for possible direct admission. Spoke with Dr. Cherlynn Cornfield,  general surgery, who advised planned surgery in the morning and can hold here in the ED until morning. Advised starting Rocephin and Flagyl. Antibiotics ordered. PRN pain medications placed. Advised NPO at midnight. Awaiting transfer to [facility] for surgery in the morning. Overnight  team aware.  Final diagnoses:  Acute cholecystitis    ED Discharge Orders     None          Oretha Birch 10/28/23 2321    Dalene Duck, MD 10/30/23 [phone]

## 2023-10-28 NOTE — ED Triage Notes (Addendum)
 Patient reports epigastric abdominal pain described as pressure that started last night. Denies nausea, vomiting, or diarrhea. Endorses constipation - last BM 2 days ago. Denies fevers. Hx of diverticulitis - states this feels similar but worse pain.

## 2023-10-29 ENCOUNTER — Inpatient Hospital Stay (HOSPITAL_COMMUNITY)

## 2023-10-29 ENCOUNTER — Other Ambulatory Visit: Payer: Self-pay

## 2023-10-29 ENCOUNTER — Encounter (HOSPITAL_COMMUNITY)
Admission: EM | Disposition: A | Discharge status: Home / Self Care | Payer: Self-pay | Source: Home / Self Care | Attending: General Surgery

## 2023-10-29 ENCOUNTER — Encounter (HOSPITAL_COMMUNITY): Payer: Self-pay | Admitting: General Surgery

## 2023-10-29 HISTORY — PX: CHOLECYSTECTOMY: SHX55

## 2023-10-29 LAB — HEPATIC FUNCTION PANEL
ALT: 32 U/L (ref 0–44)
AST: 40 U/L (ref 15–41)
Albumin: 3.7 g/dL (ref 3.5–5.0)
Alkaline Phosphatase: 86 U/L (ref 38–126)
Bilirubin, Direct: <0.1 mg/dL (ref 0.0–0.2)
Total Bilirubin: 0.6 mg/dL (ref 0.0–1.2)
Total Protein: 6.8 g/dL (ref 6.5–8.1)

## 2023-10-29 LAB — LIPASE, BLOOD: Lipase: 38 U/L (ref 11–51)

## 2023-10-29 LAB — SURGICAL PCR SCREEN
MRSA, PCR: POSITIVE — AB
Staphylococcus aureus: POSITIVE — AB

## 2023-10-29 SURGERY — LAPAROSCOPIC CHOLECYSTECTOMY
Anesthesia: General

## 2023-10-29 MED ORDER — BUPIVACAINE LIPOSOME 1.3 % IJ SUSP
INTRAMUSCULAR | Status: AC
Start: 2023-10-29 — End: 2023-10-29
  Filled 2023-10-29: qty 20

## 2023-10-29 MED ORDER — BUPIVACAINE-EPINEPHRINE (PF) 0.25% -1:200000 IJ SOLN
INTRAMUSCULAR | Status: AC
  Filled 2023-10-29: qty 30

## 2023-10-29 MED ORDER — ACETAMINOPHEN 500 MG PO TABS: 1000.0000 mg | ORAL_TABLET | Freq: Once | ORAL | Status: DC

## 2023-10-29 MED ORDER — INDOCYANINE GREEN 25 MG IV SOLR
1.2500 mg | Freq: Once | INTRAVENOUS | Status: AC
  Administered 2023-10-29: 1.25 mg via INTRAVENOUS
  Filled 2023-10-29: qty 10

## 2023-10-29 MED ORDER — ACETAMINOPHEN 500 MG PO TABS
1000.0000 mg | ORAL_TABLET | Freq: Four times a day (QID) | ORAL | Status: AC | PRN
Start: 2023-10-29 — End: ?

## 2023-10-29 MED ORDER — LACTATED RINGERS IV SOLN: INTRAVENOUS | Status: DC

## 2023-10-29 MED ORDER — HYDROMORPHONE HCL 1 MG/ML IJ SOLN: 0.5000 mg | INTRAMUSCULAR | Status: DC | PRN

## 2023-10-29 MED ORDER — 0.9 % SODIUM CHLORIDE (POUR BTL) OPTIME
TOPICAL | Status: DC | PRN
  Administered 2023-10-29: 1000 mL

## 2023-10-29 MED ORDER — DEXAMETHASONE SODIUM PHOSPHATE 10 MG/ML IJ SOLN
INTRAMUSCULAR | Status: DC | PRN
  Administered 2023-10-29: 10 mg via INTRAVENOUS

## 2023-10-29 MED ORDER — METRONIDAZOLE 500 MG/100ML IV SOLN
500.0000 mg | Freq: Two times a day (BID) | INTRAVENOUS | Status: DC
  Administered 2023-10-29: 500 mg via INTRAVENOUS
  Filled 2023-10-29: qty 100

## 2023-10-29 MED ORDER — ROCURONIUM BROMIDE 10 MG/ML (PF) SYRINGE
PREFILLED_SYRINGE | INTRAVENOUS | Status: AC
Start: 2023-10-29 — End: 2023-10-29
  Filled 2023-10-29: qty 10

## 2023-10-29 MED ORDER — OXYCODONE HCL 5 MG PO TABS: 5.0000 mg | ORAL_TABLET | Freq: Four times a day (QID) | ORAL | Status: DC | PRN

## 2023-10-29 MED ORDER — OXYCODONE HCL 5 MG PO TABS: 5.0000 mg | ORAL_TABLET | Freq: Once | ORAL | Status: DC | PRN

## 2023-10-29 MED ORDER — MUPIROCIN 2 % EX OINT
TOPICAL_OINTMENT | Freq: Two times a day (BID) | CUTANEOUS | Status: AC
Start: 2023-10-29 — End: ?

## 2023-10-29 MED ORDER — SODIUM CHLORIDE 0.9 % IV SOLN: 2.0000 g | Freq: Once | INTRAVENOUS | Status: DC

## 2023-10-29 MED ORDER — MIDAZOLAM HCL 5 MG/5ML IJ SOLN
INTRAMUSCULAR | Status: DC | PRN
Start: 2023-10-29 — End: 2023-10-29
  Administered 2023-10-29: 2 mg via INTRAVENOUS

## 2023-10-29 MED ORDER — SUGAMMADEX SODIUM 200 MG/2ML IV SOLN
INTRAVENOUS | Status: DC | PRN
Start: 2023-10-29 — End: 2023-10-29
  Administered 2023-10-29: 200 mg via INTRAVENOUS

## 2023-10-29 MED ORDER — TRAMADOL HCL 50 MG PO TABS
50.0000 mg | ORAL_TABLET | Freq: Four times a day (QID) | ORAL | Status: DC | PRN
  Administered 2023-10-29: 50 mg via ORAL
  Filled 2023-10-29: qty 1

## 2023-10-29 MED ORDER — DOCUSATE SODIUM 100 MG PO CAPS
100.0000 mg | ORAL_CAPSULE | Freq: Two times a day (BID) | ORAL | Status: DC
  Administered 2023-10-29: 100 mg via ORAL
  Filled 2023-10-29: qty 1

## 2023-10-29 MED ORDER — DIPHENHYDRAMINE HCL 50 MG/ML IJ SOLN: 25.0000 mg | Freq: Four times a day (QID) | INTRAMUSCULAR | Status: DC | PRN

## 2023-10-29 MED ORDER — DOCUSATE SODIUM 100 MG PO CAPS: 100.0000 mg | ORAL_CAPSULE | Freq: Every day | ORAL | Status: AC | PRN

## 2023-10-29 MED ORDER — LIDOCAINE HCL (CARDIAC) PF 100 MG/5ML IV SOSY
PREFILLED_SYRINGE | INTRAVENOUS | Status: DC | PRN
  Administered 2023-10-29: 80 mg via INTRAVENOUS

## 2023-10-29 MED ORDER — ROCURONIUM BROMIDE 100 MG/10ML IV SOLN
INTRAVENOUS | Status: DC | PRN
  Administered 2023-10-29: 5 mg via INTRAVENOUS
  Administered 2023-10-29: 50 mg via INTRAVENOUS

## 2023-10-29 MED ORDER — PROPOFOL 500 MG/50ML IV EMUL
INTRAVENOUS | Status: DC | PRN
  Administered 2023-10-29: 75 ug/kg/min via INTRAVENOUS

## 2023-10-29 MED ORDER — METOPROLOL TARTRATE 5 MG/5ML IV SOLN: 5.0000 mg | Freq: Four times a day (QID) | INTRAVENOUS | Status: DC | PRN

## 2023-10-29 MED ORDER — TRAMADOL HCL 50 MG PO TABS: 50.0000 mg | ORAL_TABLET | Freq: Four times a day (QID) | ORAL | 0 refills | Status: DC | PRN

## 2023-10-29 MED ORDER — DEXMEDETOMIDINE HCL IN NACL 80 MCG/20ML IV SOLN
INTRAVENOUS | Status: DC | PRN
Start: 2023-10-29 — End: 2023-10-29
  Administered 2023-10-29: 8 ug via INTRAVENOUS
  Administered 2023-10-29: 12 ug via INTRAVENOUS

## 2023-10-29 MED ORDER — SIMETHICONE 80 MG PO CHEW: 40.0000 mg | CHEWABLE_TABLET | Freq: Four times a day (QID) | ORAL | Status: DC | PRN

## 2023-10-29 MED ORDER — BUPIVACAINE LIPOSOME 1.3 % IJ SUSP
INTRAMUSCULAR | Status: DC | PRN
  Administered 2023-10-29: 20 mL

## 2023-10-29 MED ORDER — ONDANSETRON HCL 4 MG/2ML IJ SOLN
INTRAMUSCULAR | Status: DC | PRN
Start: 2023-10-29 — End: 2023-10-29
  Administered 2023-10-29: 4 mg via INTRAVENOUS

## 2023-10-29 MED ORDER — HEPARIN SODIUM (PORCINE) 5000 UNIT/ML IJ SOLN
5000.0000 [IU] | Freq: Three times a day (TID) | INTRAMUSCULAR | Status: DC
  Administered 2023-10-29: 5000 [IU] via SUBCUTANEOUS
  Filled 2023-10-29: qty 1

## 2023-10-29 MED ORDER — FENTANYL CITRATE (PF) 250 MCG/5ML IJ SOLN
INTRAMUSCULAR | Status: AC
  Filled 2023-10-29: qty 5

## 2023-10-29 MED ORDER — PROPOFOL 10 MG/ML IV BOLUS
INTRAVENOUS | Status: DC | PRN
  Administered 2023-10-29: 200 mg via INTRAVENOUS

## 2023-10-29 MED ORDER — MUPIROCIN 2 % EX OINT
TOPICAL_OINTMENT | Freq: Two times a day (BID) | CUTANEOUS | Status: DC
  Filled 2023-10-29: qty 22

## 2023-10-29 MED ORDER — TRAMADOL HCL 50 MG PO TABS: 50.0000 mg | ORAL_TABLET | Freq: Four times a day (QID) | ORAL | 0 refills | Status: AC | PRN

## 2023-10-29 MED ORDER — HYDRALAZINE HCL 20 MG/ML IJ SOLN
INTRAMUSCULAR | Status: AC
  Filled 2023-10-29: qty 1

## 2023-10-29 MED ORDER — ACETAMINOPHEN 500 MG PO TABS
1000.0000 mg | ORAL_TABLET | Freq: Four times a day (QID) | ORAL | Status: DC
  Administered 2023-10-29: 1000 mg via ORAL
  Filled 2023-10-29: qty 2

## 2023-10-29 MED ORDER — ONDANSETRON 4 MG PO TBDP: 4.0000 mg | ORAL_TABLET | Freq: Four times a day (QID) | ORAL | Status: DC | PRN

## 2023-10-29 MED ORDER — CHLORHEXIDINE GLUCONATE 0.12 % MT SOLN
15.0000 mL | Freq: Once | OROMUCOSAL | Status: AC
  Administered 2023-10-29: 15 mL via OROMUCOSAL

## 2023-10-29 MED ORDER — DEXMEDETOMIDINE HCL IN NACL 80 MCG/20ML IV SOLN
INTRAVENOUS | Status: AC
  Filled 2023-10-29: qty 20

## 2023-10-29 MED ORDER — PROPOFOL 10 MG/ML IV BOLUS
INTRAVENOUS | Status: AC
  Filled 2023-10-29: qty 20

## 2023-10-29 MED ORDER — METHOCARBAMOL 500 MG PO TABS
500.0000 mg | ORAL_TABLET | Freq: Four times a day (QID) | ORAL | Status: DC | PRN
  Administered 2023-10-29: 500 mg via ORAL
  Filled 2023-10-29: qty 1

## 2023-10-29 MED ORDER — WEGOVY 2.4 MG/0.75ML ~~LOC~~ SOAJ: 2.4000 mg | SUBCUTANEOUS | Status: AC

## 2023-10-29 MED ORDER — DEXAMETHASONE SODIUM PHOSPHATE 10 MG/ML IJ SOLN
INTRAMUSCULAR | Status: AC
  Filled 2023-10-29: qty 1

## 2023-10-29 MED ORDER — ONDANSETRON HCL 4 MG/2ML IJ SOLN
INTRAMUSCULAR | Status: AC
  Filled 2023-10-29: qty 2

## 2023-10-29 MED ORDER — MIDAZOLAM HCL 2 MG/2ML IJ SOLN
INTRAMUSCULAR | Status: AC
  Filled 2023-10-29: qty 2

## 2023-10-29 MED ORDER — FENTANYL CITRATE PF 50 MCG/ML IJ SOSY: 25.0000 ug | PREFILLED_SYRINGE | INTRAMUSCULAR | Status: DC | PRN

## 2023-10-29 MED ORDER — FENTANYL CITRATE (PF) 250 MCG/5ML IJ SOLN
INTRAMUSCULAR | Status: DC | PRN
  Administered 2023-10-29: 50 ug via INTRAVENOUS
  Administered 2023-10-29: 100 ug via INTRAVENOUS

## 2023-10-29 MED ORDER — ONDANSETRON HCL 4 MG/2ML IJ SOLN
4.0000 mg | Freq: Four times a day (QID) | INTRAMUSCULAR | Status: DC | PRN
Start: 2023-10-29 — End: 2023-10-29

## 2023-10-29 MED ORDER — AMISULPRIDE (ANTIEMETIC) 5 MG/2ML IV SOLN
INTRAVENOUS | Status: AC
  Filled 2023-10-29: qty 4

## 2023-10-29 MED ORDER — BUPIVACAINE-EPINEPHRINE 0.25% -1:200000 IJ SOLN
INTRAMUSCULAR | Status: DC | PRN
  Administered 2023-10-29: 30 mL

## 2023-10-29 MED ORDER — HYDRALAZINE HCL 20 MG/ML IJ SOLN
10.0000 mg | Freq: Once | INTRAMUSCULAR | Status: AC
  Administered 2023-10-29: 10 mg via INTRAVENOUS

## 2023-10-29 MED ORDER — AMISULPRIDE (ANTIEMETIC) 5 MG/2ML IV SOLN
10.0000 mg | Freq: Once | INTRAVENOUS | Status: AC | PRN
  Administered 2023-10-29: 10 mg via INTRAVENOUS

## 2023-10-29 MED ORDER — HYDRALAZINE HCL 20 MG/ML IJ SOLN: 10.0000 mg | INTRAMUSCULAR | Status: DC | PRN

## 2023-10-29 MED ORDER — LIDOCAINE HCL (PF) 2 % IJ SOLN
INTRAMUSCULAR | Status: AC
  Filled 2023-10-29: qty 5

## 2023-10-29 MED ORDER — DIPHENHYDRAMINE HCL 25 MG PO CAPS
25.0000 mg | ORAL_CAPSULE | Freq: Four times a day (QID) | ORAL | Status: DC | PRN
Start: 2023-10-29 — End: 2023-10-29

## 2023-10-29 MED ORDER — PROPOFOL 1000 MG/100ML IV EMUL
INTRAVENOUS | Status: AC
  Filled 2023-10-29: qty 100

## 2023-10-29 MED ORDER — HYRIMOZ 40 MG/0.4ML ~~LOC~~ SOAJ: 40.0000 mg | SUBCUTANEOUS | Status: AC

## 2023-10-29 MED ORDER — OXYCODONE HCL 5 MG/5ML PO SOLN: 5.0000 mg | Freq: Once | ORAL | Status: DC | PRN

## 2023-10-29 MED ORDER — LACTATED RINGERS IR SOLN
Status: DC | PRN
Start: 2023-10-29 — End: 2023-10-29
  Administered 2023-10-29: 1000 mL

## 2023-10-29 MED ORDER — BISACODYL 10 MG RE SUPP: 10.0000 mg | Freq: Every day | RECTAL | Status: DC | PRN

## 2023-10-29 SURGICAL SUPPLY — 35 items
BAG COUNTER SPONGE SURGICOUNT (BAG) ×2 IMPLANT
BENZOIN TINCTURE PRP APPL 2/3 (GAUZE/BANDAGES/DRESSINGS) IMPLANT
BNDG ADH 1X3 SHEER STRL LF (GAUZE/BANDAGES/DRESSINGS) IMPLANT
CABLE HIGH FREQUENCY MONO STRZ (ELECTRODE) ×2 IMPLANT
CHLORAPREP W/TINT 26 (MISCELLANEOUS) ×2 IMPLANT
CLIP APPLIE ROT 10 11.4 M/L (STAPLE) ×2 IMPLANT
COVER MAYO STAND XLG (MISCELLANEOUS) IMPLANT
COVER SURGICAL LIGHT HANDLE (MISCELLANEOUS) ×2 IMPLANT
DERMABOND ADVANCED .7 DNX12 (GAUZE/BANDAGES/DRESSINGS) IMPLANT
DRAPE C-ARM 42X120 X-RAY (DRAPES) IMPLANT
ELECT REM PT RETURN 15FT ADLT (MISCELLANEOUS) ×2 IMPLANT
ENDOLOOP SUT PDS II 0 18 (SUTURE) IMPLANT
GLOVE BIO SURGEON STRL SZ 6 (GLOVE) ×2 IMPLANT
GLOVE INDICATOR 6.5 STRL GRN (GLOVE) ×2 IMPLANT
GOWN STRL REUS W/ TWL LRG LVL3 (GOWN DISPOSABLE) ×2 IMPLANT
GRASPER SUT TROCAR 14GX15 (MISCELLANEOUS) ×2 IMPLANT
HEMOSTAT SNOW SURGICEL 2X4 (HEMOSTASIS) IMPLANT
IRRIGATION SUCT STRKRFLW 2 WTP (MISCELLANEOUS) ×2 IMPLANT
KIT BASIN OR (CUSTOM PROCEDURE TRAY) ×2 IMPLANT
KIT TURNOVER KIT A (KITS) ×4 IMPLANT
NDL INSUFFLATION 14GA 120MM (NEEDLE) ×2 IMPLANT
NEEDLE INSUFFLATION 14GA 120MM (NEEDLE) ×1 IMPLANT
SCISSORS LAP 5X35 DISP (ENDOMECHANICALS) ×2 IMPLANT
SET CHOLANGIOGRAPH MIX (MISCELLANEOUS) IMPLANT
SET TUBE SMOKE EVAC HIGH FLOW (TUBING) ×2 IMPLANT
SLEEVE Z-THREAD 5X100MM (TROCAR) ×2 IMPLANT
SPIKE FLUID TRANSFER (MISCELLANEOUS) ×2 IMPLANT
STRIP CLOSURE SKIN 1/2X4 (GAUZE/BANDAGES/DRESSINGS) IMPLANT
SUT MNCRL AB 4-0 PS2 18 (SUTURE) ×2 IMPLANT
SYSTEM BAG RETRIEVAL 10MM (BASKET) IMPLANT
TOWEL OR 17X26 10 PK STRL BLUE (TOWEL DISPOSABLE) ×2 IMPLANT
TRAY LAPAROSCOPIC (CUSTOM PROCEDURE TRAY) ×2 IMPLANT
TROCAR ADV FIXATION 12X100MM (TROCAR) ×2 IMPLANT
TROCAR XCEL NON-BLD 5MMX100MML (ENDOMECHANICALS) IMPLANT
TROCAR Z-THREAD OPTICAL 5X100M (TROCAR) ×2 IMPLANT

## 2023-10-29 NOTE — H&P (Signed)
 Surgical Evaluation  Chief Complaint: abdominal pain  HPI: 50 male who presented to [facility] yesterday evening with abdominal pain.  This began around 3:30 AM on Sunday morning and is located in the right upper quadrant.  He reports this is about the worst pain he has ever had in his life.  He has had episodic similar pain over the last 4 to 5 weeks and had attributed this to diverticulitis and notes that he has been on multiple rounds of doxycycline to treat this.  Some associated nausea when the pain was severe.  No vomiting. He does have a history of GLP-1 use and has lost about 96 pounds recently.  Otherwise no significant medical problems and denies any previous abdominal surgery aside from a right inguinal hernia repair with mesh in 2015.  No Known Allergies  Past Medical History:  Diagnosis Date   Cluster headaches    GERD (gastroesophageal reflux disease)     History reviewed. No pertinent surgical history.  History reviewed. No pertinent family history.  Social History   Socioeconomic History   Marital status: Single    Spouse name: Not on file   Number of children: Not on file   Years of education: Not on file   Highest education level: Not on file  Occupational History   Not on file  Tobacco Use   Smoking status: Every Day    Current packs/day: 0.50    Types: Cigarettes   Smokeless tobacco: Never  Substance and Sexual Activity   Alcohol use: Yes    Comment: occasionally   Drug use: No   Sexual activity: Not on file  Other Topics Concern   Not on file  Social History Narrative   Not on file   Social Drivers of Health   Financial Resource Strain: Low Risk  (09/10/2022)   Received from [facility]   Overall Financial Resource Strain (CARDIA)    Difficulty of Paying Living Expenses: Not very hard  Food Insecurity: No Food Insecurity (10/29/2023)   Hunger Vital Sign    Worried About Running Out of Food in the Last Year: Never true    Ran Out of  Food in the Last Year: Never true  Transportation Needs: No Transportation Needs (10/29/2023)   PRAPARE - Administrator, Civil Service (Medical): No    Lack of Transportation (Non-Medical): No  Physical Activity: Insufficiently Active (09/10/2022)   Received from [facility]   Exercise Vital Sign    On average, how many days per week do you engage in moderate to strenuous exercise (like a brisk walk)?: 1 day    On average, how many minutes do you engage in exercise at this level?: 10 min  Stress: No Stress Concern Present (09/10/2022)   Received from [facility] - Occupational Stress Questionnaire    Feeling of Stress : Only a little  Social Connections: Moderately Integrated (09/10/2022)   Received from [facility]   Social Network    How would you rate your social network (family, work, friends)?: Adequate participation with social networks    No current facility-administered medications on file prior to encounter.   Current Outpatient Medications on File Prior to Encounter  Medication Sig Dispense Refill   amphetamine-dextroamphetamine (ADDERALL) 20 MG tablet Take 20 mg by mouth daily.     amphetamine-dextroamphetamine (ADDERALL) 30 MG tablet Take by mouth. 1, 1, 1/2     emtricitabine-tenofovir (TRUVADA) 200-300 MG tablet  Take 1 tablet by mouth daily.     hydrOXYzine (ATARAX) 25 MG tablet Take 25 mg by mouth 3 (three) times daily as needed.     HYRIMOZ 40 MG/0.4ML SOAJ      omeprazole (PRILOSEC) 20 MG capsule Take 20 mg by mouth 3 (three) times daily as needed.     omeprazole (PRILOSEC) 40 MG capsule Take 40 mg by mouth 2 (two) times daily.     sildenafil (VIAGRA) 100 MG tablet Take 50 mg by mouth daily as needed.     telmisartan (MICARDIS) 80 MG tablet Take 80 mg by mouth daily.     WEGOVY 2.4 MG/0.75ML SOAJ Inject 2.4 mg into the skin once a week.     zolpidem (AMBIEN) 10 MG tablet Take 10 mg by mouth at bedtime.       Review of Systems: a complete, 10pt review of systems was completed with pertinent positives and negatives as documented in the HPI  Physical Exam: Vitals:   10/29/23 0209 10/29/23 0445  BP: (!) 149/104 (!) 137/105  Pulse: 62 (!) 56  Resp: 16 16  Temp: 97.8 F (36.6 C) 97.6 F (36.4 C)  SpO2: 99% 99%   Gen: A&Ox3, no distress  Chest: respiratory effort is normal.   Cardiovascular: RRR Gastrointestinal: soft, nondistended, mildly tender in right upper>lower quadrants without peritoneal signs.  Neuro: no gross deficit Psych: appropriate mood and affect, normal insight/judgment intact  Skin: warm and dry      Latest Ref Rng & Units 10/28/2023    7:13 PM 06/30/2009    5:27 PM  CBC  WBC 4.0 - 10.5 K/uL 8.6  6.9   Hemoglobin 13.0 - 17.0 g/dL 16.1  09.6   Hematocrit 39.0 - 52.0 % 48.8  48.8   Platelets 150 - 400 K/uL 273  194        Latest Ref Rng & Units 10/29/2023    4:47 AM 10/28/2023    7:13 PM 06/30/2009    5:27 PM  CMP  Glucose 70 - 99 mg/dL  98  91   BUN 6 - 20 mg/dL  11  20   Creatinine 0.45 - 1.24 mg/dL  4.09  1.0   Sodium 811 - 145 mmol/L  141  143   Potassium 3.5 - 5.1 mmol/L  4.1  4.0   Chloride 98 - 111 mmol/L  103  105   CO2 22 - 32 mmol/L  23  30   Calcium 8.9 - 10.3 mg/dL  9.2  9.6   Total Protein 6.5 - 8.1 g/dL 6.8  7.4  7.6   Total Bilirubin 0.0 - 1.2 mg/dL 0.6  <9.1  0.4   Alkaline Phos 38 - 126 U/L 86  117  106   AST 15 - 41 U/L 40  30  50   ALT 0 - 44 U/L 32  25  72     No results found for: INR, PROTIME  Imaging: US  Abdomen Limited RUQ (LIVER/GB) Result Date: 10/28/2023 CLINICAL DATA:  151471 RUQ pain 151471 EXAM: ULTRASOUND ABDOMEN LIMITED RIGHT UPPER QUADRANT COMPARISON:  None Available. FINDINGS: Gallbladder: Calcified gallstones and gallbladder sludge within the gallbladder lumen. Associated gallbladder wall thickening and pericholecystic fluid. Positive sonographic Murphy sign noted by sonographer. Common bile duct: Diameter: 4 mm  Liver: No focal lesion identified. Within normal limits in parenchymal echogenicity. Portal vein is patent on color Doppler imaging with normal direction of blood flow towards the liver. Other: None. IMPRESSION: Cholelithiasis and gallbladder sludge  with associated acute cholecystitis. Electronically Signed   By: Morgane  Naveau M.D.   On: 10/28/2023 21:54     A/P: Acute calculous cholecystitis. I recommend proceeding with laparoscopic cholecystectomy and reviewed the relevant anatomy and surgical technique with him. Discussed risks of surgery including bleeding, infection, pain, scarring, intraabdominal injury specifically to the common bile duct and sequelae, bile leak, conversion to open surgery, subtotal cholecystectomy, failure to resolve symptoms, cardiovascular/ pulmonary/ thromboembolic complications etc. Questions welcomed and answered. Will plan surgery this morning and potential discharge home this evening pending intraoperative/postoperative course.     Patient Active Problem List   Diagnosis Date Noted   Cholecystitis 10/28/2023       Alan Hung, MD [facility]  See AMION to contact appropriate on-call provider

## 2023-10-29 NOTE — Op Note (Signed)
 Operative Note  Alan Young 43 y.o. male [id number]  10/29/2023  Surgeon: Adalberto Acton MD FACS  Procedure performed: Laparoscopic Cholecystectomy with near-infrared fluorescent cholangiography  Procedure classification: emergent  Preop diagnosis: Acute calculus cholecystitis Post-op diagnosis/intraop findings: same  Specimens: gallbladder  Retained items: none  EBL: minimal  Complications: none  Description of procedure: After confirming informed consent the patient was brought to the operating room. Antibiotics were administered. SCD's were applied. General endotracheal anesthesia was initiated and a formal time-out was performed. The abdomen was prepped and draped in the usual sterile fashion and the abdomen was entered using a left subcostal Veress needle after instilling the site with local. Insufflation to was obtained, 5mm trocar and camera inserted using optical entry in the right upper quadrant, and gross inspection revealed no evidence of injury from our entry or other intraabdominal abnormalities aside from small volume of insufflation along preperitoneal plane and omentum.  Two 5mm trocars were introduced in the infraumbilical and right anterior axillary lines under direct visualization and following infiltration with local. A 12mm trocar was placed in the epigastrium.  The gallbladder is pale and extremely edematous.  The gallbladder fundus was retracted cephalad and the infundibulum was retracted laterally. A combination of hook electrocautery and blunt dissection was utilized to clear the peritoneum from the neck and cystic duct, circumferentially isolating the cystic artery and cystic duct and lifting the gallbladder from the cystic plate. The critical view of safety was achieved with the cystic artery, cystic duct, and liver bed visualized between them with no other structures. Near-infrared fluorescent cholangiography was then activated which demonstrated concordant  anatomy; this also illuminated the common bile duct medially and this was well away from the area of dissection.  The cystic artery was clipped with a single clip proximally and distally and divided as was the cystic duct with three clips on the proximal end. The gallbladder was dissected from the liver plate using electrocautery. Once freed the gallbladder was placed in an endocatch bag and removed intact through the epigastric trocar site. A small amount of bleeding on the liver bed was controlled with cautery. The right upper quadrant was irrigated with warm sterile saline; the effluent was clear. Hemostasis was once again confirmed, and reinspection of the abdomen revealed no injuries. The clips were well apposed without any bile leak from the ligated remnant cystic duct or the liver bed there on direct inspection nor with near-infrared fluorescent cholangiography.  The abdominal cavity was again surveyed and confirmed to be without any gross abnormality or injury present. The 12mm trocar site in the epigastrium was closed with a 0 vicryl in the fascia under direct visualization using a PMI device. The abdomen was desufflated and all trocars removed. The skin incisions were closed with subcuticular 4-0 monocryl and Dermabond. The patient was awakened, extubated and transported to the recovery room in stable condition.    All counts were correct at the completion of the case.

## 2023-10-29 NOTE — Anesthesia Postprocedure Evaluation (Signed)
 Anesthesia Post Note  Patient: Lorrin Nawrot  Procedure(s) Performed: LAPAROSCOPIC CHOLECYSTECTOMY     Patient location during evaluation: PACU Anesthesia Type: General Level of consciousness: awake and alert Pain management: pain level controlled Vital Signs Assessment: post-procedure vital signs reviewed and stable Respiratory status: spontaneous breathing, nonlabored ventilation, respiratory function stable and patient connected to nasal cannula oxygen Cardiovascular status: blood pressure returned to baseline and stable Postop Assessment: no apparent nausea or vomiting Anesthetic complications: no  No notable events documented.  Last Vitals:  Vitals:   10/29/23 1141 10/29/23 1154  BP: (!) 160/98 (!) 154/98  Pulse:  64  Resp:  18  Temp:  36.4 C  SpO2:  100%    Last Pain:  Vitals:   10/29/23 1206  TempSrc:   PainSc: 4                  Abbegayle Denault L Saarah Dewing

## 2023-10-29 NOTE — Anesthesia Preprocedure Evaluation (Addendum)
 Anesthesia Evaluation  Patient identified by MRN, date of birth, ID band Patient awake    Reviewed: Allergy & Precautions, NPO status , Patient's Chart, lab work & pertinent test results  Airway Mallampati: I  TM Distance: >3 FB Neck ROM: Full    Dental no notable dental hx. (+) Teeth Intact, Dental Advisory Given   Pulmonary Current Smoker and Patient abstained from smoking.   Pulmonary exam normal breath sounds clear to auscultation       Cardiovascular hypertension (not on medications after weight loss), Normal cardiovascular exam Rhythm:Regular Rate:Normal     Neuro/Psych  Headaches  negative psych ROS   GI/Hepatic Neg liver ROS,GERD  ,,  Endo/Other  negative endocrine ROS    Renal/GU negative Renal ROS  negative genitourinary   Musculoskeletal negative musculoskeletal ROS (+)    Abdominal   Peds  (+) ADHD Hematology negative hematology ROS (+)   Anesthesia Other Findings   Reproductive/Obstetrics                             Anesthesia Physical Anesthesia Plan  ASA: 3  Anesthesia Plan: General   Post-op Pain Management: Precedex, Dilaudid IV and Ofirmev IV (intra-op)*   Induction: Intravenous  PONV Risk Score and Plan: 1 and Midazolam, Dexamethasone and Ondansetron  Airway Management Planned: Oral ETT  Additional Equipment:   Intra-op Plan:   Post-operative Plan: Extubation in OR  Informed Consent: I have reviewed the patients History and Physical, chart, labs and discussed the procedure including the risks, benefits and alternatives for the proposed anesthesia with the patient or authorized representative who has indicated his/her understanding and acceptance.     Dental advisory given  Plan Discussed with: CRNA  Anesthesia Plan Comments:        Anesthesia Quick Evaluation

## 2023-10-29 NOTE — Transfer of Care (Signed)
 Immediate Anesthesia Transfer of Care Note  Patient: Alan Young  Procedure(s) Performed: LAPAROSCOPIC CHOLECYSTECTOMY  Patient Location: PACU  Anesthesia Type:General  Level of Consciousness: drowsy  Airway & Oxygen Therapy: Patient Spontanous Breathing and Patient connected to nasal cannula oxygen  Post-op Assessment: Post vital signs: Reviewed  Last Vitals:  Vitals Value Taken Time  BP 165/113 10/29/23 10:54  Temp    Pulse 84 10/29/23 10:57  Resp 16 10/29/23 10:59  SpO2 93 % 10/29/23 10:57  Vitals shown include unfiled device data.  Last Pain:  Vitals:   10/29/23 0815  TempSrc: Oral  PainSc: 0-No pain      Patients Stated Pain Goal: 0 (10/29/23 0805)  Complications: No notable events documented.

## 2023-10-29 NOTE — Anesthesia Procedure Notes (Addendum)
 Procedure Name: Intubation Date/Time: 10/29/2023 9:39 AM  Performed by: Elaina Graver, CRNAPre-anesthesia Checklist: Patient identified, Emergency Drugs available, Suction available and Patient being monitored Patient Re-evaluated:Patient Re-evaluated prior to induction Oxygen Delivery Method: Circle System Utilized Preoxygenation: Pre-oxygenation with 100% oxygen Induction Type: IV induction Ventilation: Mask ventilation without difficulty Laryngoscope Size: Mac and 4 Grade View: Grade II Tube type: Oral Tube size: 7.5 mm Number of attempts: 2 Airway Equipment and Method: Stylet and Oral airway Placement Confirmation: ETT inserted through vocal cords under direct vision, positive ETCO2 and breath sounds checked- equal and bilateral Secured at: 23 cm Tube secured with: Tape Dental Injury: Teeth and Oropharynx as per pre-operative assessment  Comments: Intubation by EMT student, Lindon Hammans.

## 2023-10-29 NOTE — Plan of Care (Signed)
   Problem: Education: Goal: Knowledge of General Education information will improve Description Including pain rating scale, medication(s)/side effects and non-pharmacologic comfort measures Outcome: Progressing   Problem: Health Behavior/Discharge Planning: Goal: Ability to manage health-related needs will improve Outcome: Progressing

## 2023-10-29 NOTE — Discharge Instructions (Signed)

## 2023-10-29 NOTE — Discharge Summary (Signed)
 [facility] Discharge Summary   Patient ID: Alan Young MRN: [id number] DOB/AGE: Jan 07, 1981 43 y.o.  Admit date: 10/28/2023 Discharge date: 10/29/2023  Admitting Diagnosis: Acute cholecystitis   Discharge Diagnosis Patient Active Problem List   Diagnosis Date Noted   Cholecystitis 10/28/2023  S/p laparoscopic cholecystectomy   Consultants None   Imaging: US  Abdomen Limited RUQ (LIVER/GB) Result Date: 10/28/2023 CLINICAL DATA:  151471 RUQ pain 151471 EXAM: ULTRASOUND ABDOMEN LIMITED RIGHT UPPER QUADRANT COMPARISON:  None Available. FINDINGS: Gallbladder: Calcified gallstones and gallbladder sludge within the gallbladder lumen. Associated gallbladder wall thickening and pericholecystic fluid. Positive sonographic Murphy sign noted by sonographer. Common bile duct: Diameter: 4 mm Liver: No focal lesion identified. Within normal limits in parenchymal echogenicity. Portal vein is patent on color Doppler imaging with normal direction of blood flow towards the liver. Other: None. IMPRESSION: Cholelithiasis and gallbladder sludge with associated acute cholecystitis. Electronically Signed   By: Morgane  Naveau M.D.   On: 10/28/2023 21:54    Procedures Dr. Aldon Hung (10/29/23) - Laparoscopic Cholecystectomy   Hospital Course:  Patient is a 43 year old male who presented to the ED with abdominal pain.  Workup showed acute cholecystitis.  Patient was admitted and underwent procedure listed above.  Tolerated procedure well and was transferred to the floor.  Diet was advanced as tolerated.  On POD0, the patient was voiding well, tolerating diet, ambulating well, pain well controlled, vital signs stable, incisions c/d/i and felt stable for discharge home.  Patient will follow up in our office in 3-4 weeks and knows to call with questions or concerns.    Physical Exam: General:  Alert, NAD, pleasant, comfortable Abd:  Soft, ND, mild tenderness, incisions C/D/I  I or a member of my  team have reviewed this patient in the Controlled Substance Database.   Allergies as of 10/29/2023   No Known Allergies      Medication List     TAKE these medications    acetaminophen 500 MG tablet Commonly known as: TYLENOL Take 2 tablets (1,000 mg total) by mouth every 6 (six) hours as needed for mild pain (pain score 1-3) or fever.   amphetamine-dextroamphetamine 30 MG tablet Commonly known as: ADDERALL Take 30 mg by mouth 2 (two) times daily.   ANTACID PO Take 1 tablet by mouth daily as needed (Indigestion).   docusate sodium 100 MG capsule Commonly known as: COLACE Take 1 capsule (100 mg total) by mouth daily as needed for mild constipation.   doxycycline 100 MG capsule Commonly known as: VIBRAMYCIN Take 100 mg by mouth daily.   emtricitabine-tenofovir 200-300 MG tablet Commonly known as: TRUVADA Take 1 tablet by mouth daily.   hydrOXYzine 25 MG tablet Commonly known as: ATARAX Take 25 mg by mouth 3 (three) times daily as needed for anxiety.   Hyrimoz 40 MG/0.4ML Soaj Generic drug: Adalimumab-adaz Inject 40 mg into the skin once a week. Start taking on: November 05, 2023   levocetirizine 5 MG tablet Commonly known as: XYZAL Take 5 mg by mouth daily as needed for allergies.   mupirocin ointment 2 % Commonly known as: BACTROBAN Place into the nose 2 (two) times daily.   omeprazole 40 MG capsule Commonly known as: PRILOSEC Take 40 mg by mouth 2 (two) times daily.   sildenafil 100 MG tablet Commonly known as: VIAGRA Take 50 mg by mouth daily as needed for erectile dysfunction.   telmisartan 80 MG tablet Commonly known as: MICARDIS Take 80 mg by mouth daily as needed (  HBP).   traMADol 50 MG tablet Commonly known as: ULTRAM Take 1 tablet (50 mg total) by mouth every 6 (six) hours as needed for moderate pain (pain score 4-6).   Wegovy 2.4 MG/0.75ML Soaj Generic drug: Semaglutide-Weight Management Inject 2.4 mg into the skin once a week. Start taking  on: November 05, 2023   zolpidem 10 MG tablet Commonly known as: AMBIEN Take 10 mg by mouth at bedtime as needed for sleep.          Follow-up Information     Maczis, Puja Gosai, PA-C. Go on 11/27/2023.   Specialty: General Surgery Why: 11:15 AM. Please arrive 30 min prior to appointment time to check in. Contact information: [address] [place] [address] [facility] [place] [address] [phone]                 Signed: Annetta Killian , [facility] 10/29/2023, 3:37 PM Please see Amion for pager number during day hours 7:00am-4:30pm

## 2023-10-30 ENCOUNTER — Encounter (HOSPITAL_COMMUNITY): Payer: Self-pay | Admitting: Surgery

## 2023-10-30 LAB — SURGICAL PATHOLOGY
